# Patient Record
Sex: Female | Born: 1985 | Race: White | Hispanic: No | Marital: Married | Smoking: Never smoker
Health system: Southern US, Community
[De-identification: ages and names within clinical notes are randomized; demographics above are authoritative.]

## PROBLEM LIST (undated history)

## (undated) DIAGNOSIS — Z8489 Family history of other specified conditions: Secondary | ICD-10-CM

## (undated) DIAGNOSIS — Z789 Other specified health status: Secondary | ICD-10-CM

## (undated) HISTORY — PX: TONSILLECTOMY: SUR1361

---

## 2004-03-06 ENCOUNTER — Ambulatory Visit: Payer: Self-pay | Admitting: Otolaryngology

## 2009-06-21 ENCOUNTER — Ambulatory Visit: Payer: Self-pay | Admitting: Gastroenterology

## 2009-07-08 ENCOUNTER — Ambulatory Visit: Payer: Self-pay | Admitting: Gastroenterology

## 2018-03-30 ENCOUNTER — Other Ambulatory Visit: Payer: Self-pay | Admitting: Student

## 2018-03-30 DIAGNOSIS — R1011 Right upper quadrant pain: Secondary | ICD-10-CM

## 2018-03-30 DIAGNOSIS — R110 Nausea: Secondary | ICD-10-CM

## 2018-04-12 ENCOUNTER — Encounter
Admission: RE | Admit: 2018-04-12 | Discharge: 2018-04-12 | Disposition: A | Discharge status: Home / Self Care | Payer: BLUE CROSS/BLUE SHIELD | Source: Ambulatory Visit | Attending: Student | Admitting: Student

## 2018-04-12 DIAGNOSIS — R110 Nausea: Secondary | ICD-10-CM | POA: Insufficient documentation

## 2018-04-12 DIAGNOSIS — R1011 Right upper quadrant pain: Secondary | ICD-10-CM | POA: Insufficient documentation

## 2018-04-12 MED ORDER — TECHNETIUM TC 99M MEBROFENIN IV KIT
5.0000 | PACK | Freq: Once | INTRAVENOUS | Status: AC | PRN
  Administered 2018-04-12: 5.431 via INTRAVENOUS

## 2018-04-18 ENCOUNTER — Encounter: Payer: Self-pay | Admitting: General Surgery

## 2018-04-18 ENCOUNTER — Ambulatory Visit: Payer: BLUE CROSS/BLUE SHIELD | Admitting: General Surgery

## 2018-04-18 ENCOUNTER — Other Ambulatory Visit: Payer: Self-pay

## 2018-04-18 DIAGNOSIS — K811 Chronic cholecystitis: Secondary | ICD-10-CM | POA: Diagnosis not present

## 2018-04-18 NOTE — Patient Instructions (Signed)

## 2018-04-18 NOTE — Progress Notes (Signed)
Patient ID: Catherine Owen, female   DOB: 03/18/1985, 33 y.o.   MRN: [id number]  Chief Complaint  Patient presents with  . Abdominal Pain    HPI Catherine Owen is a 33 y.o. female here today for a evaluation of right upper abdominal pain. Patient states she first noticed this about 3 years ago.  She has had episodes every 3-4 months since that time.  In August 2019 she return to her PCP because of a recurrent episode of pain.  In the last 2 month the pain has got worse, and is coming more frequently. She states the pain radiates into her right shoulder and lower mid back, starting in the epigastrium/right upper quadrant. Wakes up in the middle of night with pain and nausea. Bowel moves are not normal, stools float.  Had a HIDA scan done on 04/12/2018.  The unsure administered for the stimulation portion of the test reproduced her symptoms.  Only one episode is resolved with incidence of severe vomiting.  Most episodes result in episodes of the watery stool without blood or mucus.  She does report that at times her stools "float".  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  She has 2 children age 55 and 37, both girls.  She was accompanied today by her sister who is a patient in this office.  History reviewed. No pertinent past medical history.  Past Surgical History:  Procedure Laterality Date  . TONSILLECTOMY      Family History  Problem Relation Age of Onset  . Colon cancer Mother     Social History Social History   Tobacco Use  . Smoking status: Never Smoker  . Smokeless tobacco: Never Used  Substance Use Topics  . Alcohol use: Not Currently  . Drug use: Not on file    Allergies  Allergen Reactions  . Codeine Nausea And Vomiting    Current Outpatient Medications  Medication Sig Dispense Refill  . buPROPion (WELLBUTRIN XL) 300 MG 24 hr tablet Take 300 mg by mouth at bedtime.     Marland Kitchen levonorgestrel (MIRENA) 20 MCG/24HR IUD 1 each by  Intrauterine route.     . clobetasol ointment (TEMOVATE) 0.05 % Apply 1 application topically daily as needed (yeast infections).     Marland Kitchen ibuprofen (ADVIL,MOTRIN) 200 MG tablet Take 400 mg by mouth every 6 (six) hours as needed for moderate pain.    Marland Kitchen loratadine (CLARITIN) 10 MG tablet Take 10 mg by mouth daily as needed for allergies.     No current facility-administered medications for this visit.     Review of Systems Review of Systems  Constitutional: Negative.   Respiratory: Negative.   Gastrointestinal: Positive for abdominal pain, diarrhea and nausea. Negative for constipation and vomiting.    Blood pressure (!) 141/97, pulse 91, temperature 98.1 F (36.7 C), temperature source Skin, height 5\' 6"  (1.676 m), weight 152 lb (68.9 kg), SpO2 98 %.  Physical Exam Physical Exam Constitutional:      Appearance: She is well-developed.  Eyes:     General: No scleral icterus.    Conjunctiva/sclera: Conjunctivae normal.  Neck:     Musculoskeletal: Neck supple.  Cardiovascular:     Rate and Rhythm: Normal rate and regular rhythm.     Heart sounds: Normal heart sounds.  Pulmonary:     Effort: Pulmonary effort is normal.     Breath sounds: Normal breath sounds.  Abdominal:     General: Bowel sounds are normal.  Lymphadenopathy:     Cervical: No cervical adenopathy.  Skin:    General: Skin is warm and dry.  Neurological:     Mental Status: She is alert and oriented to person, place, and time.     Data Reviewed HIDA scan with ejection fraction dated April 12, 2018 completed at [facility] was reviewed.  Low EF at 32%.  Reproduction of symptoms.  Patent biliary tree. Laboratory studies from [facility] clinic dated March 29, 2018 showed a negative H. pylori breath test. Colonoscopy from [facility] dated February 20, 2016 showed a sessile serrated polyp without dysplasia in the cecum.  6 x 3 mm.  The procedure was completed for report of rectal bleeding.    Assessment    Chronic  cholecystitis, family history of biliary colic/cholelithiasis.    Plan  We spent a long time reviewing the pros and cons of elective cholecystectomy based on the information at hand.  Her symptoms are suggestive of a biliary tract, especially considering her failure to improve on Dexilant and Nexium.  3 out of 4 patients with a reproduction of symptoms during nuclear imaging stimulation will obtain a benefit from elective cholecystectomy.  1 out of 4 will not.  The potential for postprandial diarrhea was discussed.     At this time the patient is interested in proceeding to cholecystectomy.  Arrangements have been made for this to be scheduled on April 20, 2018.  Laparoscopic Cholecystectomy with Intraoperative Cholangiogram. The procedure, including it's potential risks and complications (including but not limited to infection, bleeding, injury to intra-abdominal organs or bile ducts, bile leak, poor cosmetic result, sepsis and death) were discussed with the patient in detail. Non-operative options, including their inherent risks (acute calculous cholecystitis with possible choledocholithiasis or gallstone pancreatitis, with the risk of ascending cholangitis, sepsis, and death) were discussed as well. The patient expressed and understanding of what we discussed and wishes to proceed with laparoscopic cholecystectomy. The patient further understands that if it is technically not possible, or it is unsafe to proceed laparoscopically, that I will convert to an open cholecystectomy.  HPI, Physical Exam, Assessment and Plan have been scribed under the direction and in the presence of Catherine Curry, MD.  Ples Specter, CMA  I have completed the exam and reviewed the above documentation for accuracy and completeness.  I agree with the above.  Museum/gallery conservator has been used and any errors in dictation or transcription are unintentional.  Catherine Owen, M.D., F.A.C.S.   Catherine Owen  Catherine Owen 04/18/2018, 2:17 PM  Patient's surgery to be scheduled for 04-20-18 at [facility] with Dr. Lemar Livings.  The patient is aware she will be contacted by the Pre-Admission Testing Department to complete a phone interview sometime in the near future.  The patient is aware to call the office should she have further questions.   Nicholes Mango, CMA

## 2018-04-19 ENCOUNTER — Other Ambulatory Visit: Payer: Self-pay

## 2018-04-19 ENCOUNTER — Encounter
Admission: RE | Admit: 2018-04-19 | Discharge: 2018-04-19 | Disposition: A | Discharge status: Home / Self Care | Payer: BLUE CROSS/BLUE SHIELD | Source: Ambulatory Visit | Attending: General Surgery | Admitting: General Surgery

## 2018-04-19 HISTORY — DX: Family history of other specified conditions: Z84.89

## 2018-04-19 HISTORY — DX: Other specified health status: Z78.9

## 2018-04-19 NOTE — Patient Instructions (Signed)
Your procedure is scheduled on: 04-20-18 Report to Same Day Surgery 2nd floor medical mall [facility] Entrance-take elevator on left to 2nd floor.  Check in with surgery information desk.) To find out your arrival time please call [phone] between 1PM - 3PM on 04-19-18  Remember: Instructions that are not followed completely may result in serious medical risk, up to and including death, or upon the discretion of your surgeon and anesthesiologist your surgery may need to be rescheduled.    _x___ 1. Do not eat food after midnight the night before your procedure. You may drink clear liquids up to 2 hours before you are scheduled to arrive at the hospital for your procedure.  Do not drink clear liquids within 2 hours of your scheduled arrival to the hospital.  Clear liquids include  --Water or Apple juice without pulp  --Clear carbohydrate beverage such as ClearFast or Gatorade  --Black Coffee or Clear Tea (No milk, no creamers, do not add anything to  the coffee or Tea   ____Ensure clear carbohydrate drink on the way to the hospital for bariatric patients  ____Ensure clear carbohydrate drink 3 hours before surgery for Dr Rutherford Nail patients if physician instructed.   No gum chewing or hard candies.     __x__ 2. No Alcohol for 24 hours before or after surgery.   __x__3. No Smoking or e-cigarettes for 24 prior to surgery.  Do not use any chewable tobacco products for at least 6 hour prior to surgery   ____  4. Bring all medications with you on the day of surgery if instructed.    __x__ 5. Notify your doctor if there is any change in your medical condition     (cold, fever, infections).    x___6. On the morning of surgery brush your teeth with toothpaste and water.  You may rinse your mouth with mouth wash if you wish.  Do not swallow any toothpaste or mouthwash.   Do not wear jewelry, make-up, hairpins, clips or nail polish.  Do not wear lotions, powders, or perfumes. You may wear  deodorant.  Do not shave 48 hours prior to surgery. Men may shave face and neck.  Do not bring valuables to the hospital.    [facility] is not responsible for any belongings or valuables.               Contacts, dentures or bridgework may not be worn into surgery.  Leave your suitcase in the car. After surgery it may be brought to your room.  For patients admitted to the hospital, discharge time is determined by your treatment team.  _  Patients discharged the day of surgery will not be allowed to drive home.  You will need someone to drive you home and stay with you the night of your procedure.    Please read over the following fact sheets that you were given:   [facility] Preparing for Surgery   ____ Take anti-hypertensive listed below, cardiac, seizure, asthma,anti-reflux and psychiatric medicines. These include:  1. NONE  2.  3.  4.  5.  6.  ____Fleets enema or Magnesium Citrate as directed.   ____ Use CHG Soap or sage wipes as directed on instruction sheet   ____ Use inhalers on the day of surgery and bring to hospital day of surgery  ____ Stop Metformin and Janumet 2 days prior to surgery.    ____ Take 1/2 of usual insulin dose the night before surgery and none on  the morning surgery.   ____ Follow recommendations from Cardiologist, Pulmonologist or PCP regarding     stopping Aspirin, Coumadin, Plavix ,Eliquis, Effient, or Pradaxa, and Pletal.  X____Stop Anti-inflammatories such as Advil, Aleve, Ibuprofen, Motrin, Naproxen, Naprosyn, Goodies powders or aspirin products NOW-OK to take Tylenol    ____ Stop supplements until after surgery.     ____ Bring C-Pap to the hospital.

## 2018-04-20 ENCOUNTER — Ambulatory Visit: Payer: BLUE CROSS/BLUE SHIELD | Admitting: Certified Registered"

## 2018-04-20 ENCOUNTER — Ambulatory Visit
Admission: RE | Admit: 2018-04-20 | Discharge: 2018-04-20 | Disposition: A | Discharge status: Home / Self Care | Payer: BLUE CROSS/BLUE SHIELD | Attending: General Surgery | Admitting: General Surgery

## 2018-04-20 ENCOUNTER — Other Ambulatory Visit: Payer: Self-pay

## 2018-04-20 ENCOUNTER — Ambulatory Visit: Payer: BLUE CROSS/BLUE SHIELD

## 2018-04-20 ENCOUNTER — Encounter
Admission: RE | Disposition: A | Discharge status: Home / Self Care | Payer: Self-pay | Source: Home / Self Care | Attending: General Surgery

## 2018-04-20 ENCOUNTER — Encounter: Payer: Self-pay | Admitting: Emergency Medicine

## 2018-04-20 DIAGNOSIS — Z975 Presence of (intrauterine) contraceptive device: Secondary | ICD-10-CM | POA: Diagnosis not present

## 2018-04-20 DIAGNOSIS — Z79899 Other long term (current) drug therapy: Secondary | ICD-10-CM | POA: Insufficient documentation

## 2018-04-20 DIAGNOSIS — K819 Cholecystitis, unspecified: Secondary | ICD-10-CM

## 2018-04-20 DIAGNOSIS — Z885 Allergy status to narcotic agent status: Secondary | ICD-10-CM | POA: Diagnosis not present

## 2018-04-20 DIAGNOSIS — Z8489 Family history of other specified conditions: Secondary | ICD-10-CM | POA: Diagnosis not present

## 2018-04-20 DIAGNOSIS — K8020 Calculus of gallbladder without cholecystitis without obstruction: Secondary | ICD-10-CM

## 2018-04-20 DIAGNOSIS — K811 Chronic cholecystitis: Secondary | ICD-10-CM | POA: Insufficient documentation

## 2018-04-20 HISTORY — PX: CHOLECYSTECTOMY: SHX55

## 2018-04-20 LAB — HEPATIC FUNCTION PANEL
ALBUMIN: 4.6 g/dL (ref 3.5–5.0)
ALT: 12 U/L (ref 0–44)
AST: 15 U/L (ref 15–41)
Alkaline Phosphatase: 69 U/L (ref 38–126)
Bilirubin, Direct: <0.1 mg/dL (ref 0.0–0.2)
Total Bilirubin: 0.8 mg/dL (ref 0.3–1.2)
Total Protein: 7.9 g/dL (ref 6.5–8.1)

## 2018-04-20 LAB — LIPASE, BLOOD: LIPASE: 31 U/L (ref 11–51)

## 2018-04-20 LAB — POCT PREGNANCY, URINE: Preg Test, Ur: NEGATIVE

## 2018-04-20 SURGERY — LAPAROSCOPIC CHOLECYSTECTOMY WITH INTRAOPERATIVE CHOLANGIOGRAM
Anesthesia: General

## 2018-04-20 MED ORDER — DEXAMETHASONE SODIUM PHOSPHATE 10 MG/ML IJ SOLN
INTRAMUSCULAR | Status: DC | PRN
  Administered 2018-04-20: 5 mg via INTRAVENOUS

## 2018-04-20 MED ORDER — PROPOFOL 10 MG/ML IV BOLUS
INTRAVENOUS | Status: DC | PRN
  Administered 2018-04-20: 150 mg via INTRAVENOUS

## 2018-04-20 MED ORDER — ONDANSETRON HCL 4 MG/2ML IJ SOLN
INTRAMUSCULAR | Status: AC
  Filled 2018-04-20: qty 2

## 2018-04-20 MED ORDER — KETOROLAC TROMETHAMINE 30 MG/ML IJ SOLN
INTRAMUSCULAR | Status: AC
  Filled 2018-04-20: qty 1

## 2018-04-20 MED ORDER — ACETAMINOPHEN 10 MG/ML IV SOLN
INTRAVENOUS | Status: DC | PRN
  Administered 2018-04-20: 1000 mg via INTRAVENOUS

## 2018-04-20 MED ORDER — SODIUM CHLORIDE FLUSH 0.9 % IV SOLN
INTRAVENOUS | Status: AC
  Filled 2018-04-20: qty 10

## 2018-04-20 MED ORDER — MIDAZOLAM HCL 2 MG/2ML IJ SOLN
INTRAMUSCULAR | Status: AC
  Filled 2018-04-20: qty 2

## 2018-04-20 MED ORDER — PROPOFOL 10 MG/ML IV BOLUS
INTRAVENOUS | Status: AC
  Filled 2018-04-20: qty 20

## 2018-04-20 MED ORDER — FENTANYL CITRATE (PF) 250 MCG/5ML IJ SOLN
INTRAMUSCULAR | Status: AC
  Filled 2018-04-20: qty 5

## 2018-04-20 MED ORDER — DEXAMETHASONE SODIUM PHOSPHATE 10 MG/ML IJ SOLN
INTRAMUSCULAR | Status: AC
  Filled 2018-04-20: qty 1

## 2018-04-20 MED ORDER — ACETAMINOPHEN 10 MG/ML IV SOLN
INTRAVENOUS | Status: AC
  Filled 2018-04-20: qty 100

## 2018-04-20 MED ORDER — MEPERIDINE HCL 50 MG/ML IJ SOLN: 6.2500 mg | INTRAMUSCULAR | Status: DC | PRN

## 2018-04-20 MED ORDER — FENTANYL CITRATE (PF) 100 MCG/2ML IJ SOLN
INTRAMUSCULAR | Status: AC
  Filled 2018-04-20: qty 2

## 2018-04-20 MED ORDER — PROMETHAZINE HCL 12.5 MG PO TABS: 12.5000 mg | ORAL_TABLET | ORAL | 0 refills | Status: DC | PRN

## 2018-04-20 MED ORDER — KETOROLAC TROMETHAMINE 30 MG/ML IJ SOLN
INTRAMUSCULAR | Status: DC | PRN
  Administered 2018-04-20: 30 mg via INTRAVENOUS

## 2018-04-20 MED ORDER — FAMOTIDINE 20 MG PO TABS
20.0000 mg | ORAL_TABLET | Freq: Once | ORAL | Status: AC
  Administered 2018-04-20: 20 mg via ORAL

## 2018-04-20 MED ORDER — SCOPOLAMINE 1 MG/3DAYS TD PT72
1.0000 | MEDICATED_PATCH | TRANSDERMAL | Status: DC
  Administered 2018-04-20: 1.5 mg via TRANSDERMAL

## 2018-04-20 MED ORDER — OXYCODONE HCL 5 MG/5ML PO SOLN: 5.0000 mg | Freq: Once | ORAL | Status: AC | PRN

## 2018-04-20 MED ORDER — FENTANYL CITRATE (PF) 100 MCG/2ML IJ SOLN
INTRAMUSCULAR | Status: DC | PRN
  Administered 2018-04-20 (×2): 50 ug via INTRAVENOUS

## 2018-04-20 MED ORDER — SODIUM CHLORIDE 0.9 % IV SOLN
INTRAVENOUS | Status: DC | PRN
  Administered 2018-04-20: 12 mL

## 2018-04-20 MED ORDER — ROCURONIUM BROMIDE 50 MG/5ML IV SOLN
INTRAVENOUS | Status: AC
  Filled 2018-04-20: qty 1

## 2018-04-20 MED ORDER — ONDANSETRON HCL 4 MG/2ML IJ SOLN
INTRAMUSCULAR | Status: DC | PRN
  Administered 2018-04-20: 4 mg via INTRAVENOUS

## 2018-04-20 MED ORDER — FAMOTIDINE 20 MG PO TABS
ORAL_TABLET | ORAL | Status: AC
  Administered 2018-04-20: 20 mg via ORAL
  Filled 2018-04-20: qty 1

## 2018-04-20 MED ORDER — LIDOCAINE HCL (PF) 2 % IJ SOLN
INTRAMUSCULAR | Status: AC
  Filled 2018-04-20: qty 10

## 2018-04-20 MED ORDER — SCOPOLAMINE 1 MG/3DAYS TD PT72
MEDICATED_PATCH | TRANSDERMAL | Status: AC
  Administered 2018-04-20: 1.5 mg via TRANSDERMAL
  Filled 2018-04-20: qty 1

## 2018-04-20 MED ORDER — SUGAMMADEX SODIUM 200 MG/2ML IV SOLN
INTRAVENOUS | Status: DC | PRN
  Administered 2018-04-20: 130 mg via INTRAVENOUS

## 2018-04-20 MED ORDER — MIDAZOLAM HCL 2 MG/2ML IJ SOLN
INTRAMUSCULAR | Status: DC | PRN
  Administered 2018-04-20: 2 mg via INTRAVENOUS

## 2018-04-20 MED ORDER — PROMETHAZINE HCL 25 MG/ML IJ SOLN
INTRAMUSCULAR | Status: AC
  Filled 2018-04-20: qty 1

## 2018-04-20 MED ORDER — LACTATED RINGERS IV SOLN
INTRAVENOUS | Status: DC
  Administered 2018-04-20: 13:00:00 via INTRAVENOUS

## 2018-04-20 MED ORDER — HYDROCODONE-ACETAMINOPHEN 5-325 MG PO TABS: 1.0000 | ORAL_TABLET | ORAL | 0 refills | Status: DC | PRN

## 2018-04-20 MED ORDER — LIDOCAINE HCL (CARDIAC) PF 100 MG/5ML IV SOSY
PREFILLED_SYRINGE | INTRAVENOUS | Status: DC | PRN
  Administered 2018-04-20: 60 mg via INTRAVENOUS

## 2018-04-20 MED ORDER — OXYCODONE HCL 5 MG PO TABS
ORAL_TABLET | ORAL | Status: AC
  Administered 2018-04-20: 5 mg via ORAL
  Filled 2018-04-20: qty 1

## 2018-04-20 MED ORDER — FENTANYL CITRATE (PF) 100 MCG/2ML IJ SOLN
25.0000 ug | INTRAMUSCULAR | Status: DC | PRN
  Administered 2018-04-20: 25 ug via INTRAVENOUS

## 2018-04-20 MED ORDER — PROMETHAZINE HCL 25 MG/ML IJ SOLN
6.2500 mg | INTRAMUSCULAR | Status: DC | PRN
  Administered 2018-04-20: 6.25 mg via INTRAVENOUS

## 2018-04-20 MED ORDER — OXYCODONE HCL 5 MG PO TABS
5.0000 mg | ORAL_TABLET | Freq: Once | ORAL | Status: AC | PRN
  Administered 2018-04-20: 5 mg via ORAL

## 2018-04-20 MED ORDER — CEFAZOLIN SODIUM-DEXTROSE 2-4 GM/100ML-% IV SOLN
INTRAVENOUS | Status: AC
  Filled 2018-04-20: qty 100

## 2018-04-20 MED ORDER — ROCURONIUM BROMIDE 100 MG/10ML IV SOLN
INTRAVENOUS | Status: DC | PRN
  Administered 2018-04-20: 40 mg via INTRAVENOUS

## 2018-04-20 MED ORDER — SUGAMMADEX SODIUM 200 MG/2ML IV SOLN
INTRAVENOUS | Status: AC
  Filled 2018-04-20: qty 2

## 2018-04-20 SURGICAL SUPPLY — 41 items
APPLIER CLIP ROT 10 11.4 M/L (STAPLE) ×3
BLADE SURG 11 STRL SS SAFETY (MISCELLANEOUS) ×3 IMPLANT
CANISTER SUCT 1200ML W/VALVE (MISCELLANEOUS) ×3 IMPLANT
CANNULA DILATOR  5MM W/SLV (CANNULA) ×2
CANNULA DILATOR 10 W/SLV (CANNULA) ×2 IMPLANT
CANNULA DILATOR 10MM W/SLV (CANNULA) ×1
CANNULA DILATOR 5 W/SLV (CANNULA) ×4 IMPLANT
CATH CHOLANG 76X19 KUMAR (CATHETERS) ×3 IMPLANT
CHLORAPREP W/TINT 26ML (MISCELLANEOUS) ×3 IMPLANT
CLIP APPLIE ROT 10 11.4 M/L (STAPLE) ×1 IMPLANT
CLOSURE WOUND 1/2 X4 (GAUZE/BANDAGES/DRESSINGS) ×1
CONRAY 60ML FOR OR (MISCELLANEOUS) ×3 IMPLANT
COVER WAND RF STERILE (DRAPES) ×3 IMPLANT
DISSECTOR KITTNER STICK (MISCELLANEOUS) ×1 IMPLANT
DISSECTORS/KITTNER STICK (MISCELLANEOUS) ×3
DRAPE SHEET LG 3/4 BI-LAMINATE (DRAPES) ×3 IMPLANT
DRSG TEGADERM 2-3/8X2-3/4 SM (GAUZE/BANDAGES/DRESSINGS) ×12 IMPLANT
DRSG TELFA 4X3 1S NADH ST (GAUZE/BANDAGES/DRESSINGS) ×3 IMPLANT
ELECT REM PT RETURN 9FT ADLT (ELECTROSURGICAL) ×3
ELECTRODE REM PT RTRN 9FT ADLT (ELECTROSURGICAL) ×1 IMPLANT
GLOVE BIO SURGEON STRL SZ7.5 (GLOVE) ×3 IMPLANT
GLOVE INDICATOR 8.0 STRL GRN (GLOVE) ×3 IMPLANT
GOWN STRL REUS W/ TWL LRG LVL3 (GOWN DISPOSABLE) ×3 IMPLANT
GOWN STRL REUS W/TWL LRG LVL3 (GOWN DISPOSABLE) ×6
IRRIGATION STRYKERFLOW (MISCELLANEOUS) ×1 IMPLANT
IRRIGATOR STRYKERFLOW (MISCELLANEOUS) ×3
IV LACTATED RINGERS 1000ML (IV SOLUTION) ×3 IMPLANT
KIT TURNOVER KIT A (KITS) ×3 IMPLANT
LABEL OR SOLS (LABEL) ×3 IMPLANT
NDL INSUFF ACCESS 14 VERSASTEP (NEEDLE) ×3 IMPLANT
NS IRRIG 500ML POUR BTL (IV SOLUTION) ×3 IMPLANT
PACK LAP CHOLECYSTECTOMY (MISCELLANEOUS) ×3 IMPLANT
POUCH SPECIMEN RETRIEVAL 10MM (ENDOMECHANICALS) IMPLANT
SCISSORS METZENBAUM CVD 33 (INSTRUMENTS) ×3 IMPLANT
SET TUBE SMOKE EVAC HIGH FLOW (TUBING) ×3 IMPLANT
STRIP CLOSURE SKIN 1/2X4 (GAUZE/BANDAGES/DRESSINGS) ×2 IMPLANT
SUT VIC AB 0 CT2 27 (SUTURE) ×3 IMPLANT
SUT VIC AB 4-0 FS2 27 (SUTURE) ×3 IMPLANT
SWABSTK COMLB BENZOIN TINCTURE (MISCELLANEOUS) ×3 IMPLANT
TROCAR XCEL NON-BLD 11X100MML (ENDOMECHANICALS) ×3 IMPLANT
WATER STERILE IRR 1000ML POUR (IV SOLUTION) ×3 IMPLANT

## 2018-04-20 NOTE — H&P (Signed)
No change in clinical symptoms or exam.  For cholecystectomy.  

## 2018-04-20 NOTE — Discharge Instructions (Signed)

## 2018-04-20 NOTE — Anesthesia Preprocedure Evaluation (Signed)
Anesthesia Evaluation  Patient identified by MRN, date of birth, ID band Patient awake    Reviewed: Allergy & Precautions, NPO status , Patient's Chart, lab work & pertinent test results  History of Anesthesia Complications Negative for: history of anesthetic complications  Airway Mallampati: II  TM Distance: >3 FB Neck ROM: Full    Dental no notable dental hx.    Pulmonary neg pulmonary ROS, neg sleep apnea, neg COPD,    breath sounds clear to auscultation- rhonchi (-) wheezing      Cardiovascular Exercise Tolerance: Good (-) hypertension(-) CAD and (-) Past MI  Rhythm:Regular Rate:Normal - Systolic murmurs and - Diastolic murmurs    Neuro/Psych neg Seizures negative neurological ROS  negative psych ROS   GI/Hepatic negative GI ROS, Neg liver ROS,   Endo/Other  negative endocrine ROSneg diabetes  Renal/GU negative Renal ROS     Musculoskeletal negative musculoskeletal ROS (+)   Abdominal (+) - obese,   Peds  Hematology negative hematology ROS (+)   Anesthesia Other Findings Past Medical History: No date: Family history of adverse reaction to anesthesia     Comment:  MOM-N/V No date: Medical history non-contributory   Reproductive/Obstetrics                             Anesthesia Physical Anesthesia Plan  ASA: I  Anesthesia Plan: General   Post-op Pain Management:    Induction: Intravenous  PONV Risk Score and Plan: 2 and Ondansetron, Dexamethasone, Midazolam and Scopolamine patch - Pre-op  Airway Management Planned: Oral ETT  Additional Equipment:   Intra-op Plan:   Post-operative Plan: Extubation in OR  Informed Consent: I have reviewed the patients History and Physical, chart, labs and discussed the procedure including the risks, benefits and alternatives for the proposed anesthesia with the patient or authorized representative who has indicated his/her understanding  and acceptance.     Dental advisory given  Plan Discussed with: CRNA and Anesthesiologist  Anesthesia Plan Comments:         Anesthesia Quick Evaluation

## 2018-04-20 NOTE — Anesthesia Postprocedure Evaluation (Signed)
Anesthesia Post Note  Patient: Catherine Owen  Procedure(s) Performed: LAPAROSCOPIC CHOLECYSTECTOMY WITH INTRAOPERATIVE CHOLANGIOGRAM (N/A )  Patient location during evaluation: PACU Anesthesia Type: General Level of consciousness: awake and alert and oriented Pain management: pain level controlled Vital Signs Assessment: post-procedure vital signs reviewed and stable Respiratory status: spontaneous breathing, nonlabored ventilation and respiratory function stable Cardiovascular status: blood pressure returned to baseline and stable Postop Assessment: no signs of nausea or vomiting Anesthetic complications: no     Last Vitals:  Vitals:   04/20/18 1251 04/20/18 1446  BP: 129/85 136/90  Pulse: 86 100  Resp: 17 (!) 22  Temp: 36.5 C 36.5 C  SpO2: 100% 100%    Last Pain:  Vitals:   04/20/18 1446  TempSrc:   PainSc: Asleep                 Raquel Sayres

## 2018-04-20 NOTE — Transfer of Care (Signed)
Immediate Anesthesia Transfer of Care Note  Patient: Catherine Owen  Procedure(s) Performed: LAPAROSCOPIC CHOLECYSTECTOMY WITH INTRAOPERATIVE CHOLANGIOGRAM (N/A )  Patient Location: PACU  Anesthesia Type:General  Level of Consciousness: awake, alert  and drowsy  Airway & Oxygen Therapy: Patient Spontanous Breathing and Patient connected to face mask oxygen  Post-op Assessment: Report given to RN and Post -op Vital signs reviewed and stable  Post vital signs: Reviewed and stable  Last Vitals:  Vitals Value Taken Time  BP 136/90 04/20/2018  2:46 PM  Temp 36.5 C 04/20/2018  2:46 PM  Pulse 94 04/20/2018  2:51 PM  Resp 24 04/20/2018  2:51 PM  SpO2 100 % 04/20/2018  2:51 PM  Vitals shown include unvalidated device data.  Last Pain:  Vitals:   04/20/18 1446  TempSrc:   PainSc: Asleep         Complications: No apparent anesthesia complications and Patient re-intubated

## 2018-04-20 NOTE — Anesthesia Procedure Notes (Signed)
Procedure Name: Intubation Performed by: Fredderick Phenix, CRNA Pre-anesthesia Checklist: Patient identified, Emergency Drugs available, Suction available, Patient being monitored and Timeout performed Patient Re-evaluated:Patient Re-evaluated prior to induction Oxygen Delivery Method: Circle system utilized Preoxygenation: Pre-oxygenation with 100% oxygen Induction Type: IV induction Ventilation: Mask ventilation without difficulty Laryngoscope Size: Mac and 3 Grade View: Grade I Tube type: Oral Tube size: 7.0 mm Number of attempts: 1 Placement Confirmation: ETT inserted through vocal cords under direct vision Secured at: 1 cm Tube secured with: Tape

## 2018-04-20 NOTE — Anesthesia Post-op Follow-up Note (Signed)
Anesthesia QCDR form completed.        

## 2018-04-20 NOTE — Op Note (Signed)
Preoperative diagnosis: Chronic cholecystitis.  Postoperative diagnosis: Same.  Operative procedure: Laparoscopic cholecystectomy with intraoperative cholangiograms.  Operating Surgeon: Jasmine December, MD.  Anesthesia: General endotracheal.  Estimated blood loss: Less than 5 cc.  Clinical note: This 33 year old woman has had episodic right upper quadrant pain with radiation of the back.  Ultrasound was negative but HIDA scan showed a mildly depressed ejection fraction with reproduction of symptoms on stimulation.  She has had no improvement with the use of Nexium or Dexilant and she was felt to be a candidate for elective cholecystectomy based on her clinical history and family history of cholelithiasis.  Operative note: The patient underwent general endotracheal anesthesia without difficulty.  SCD stockings for DVT prevention.  The abdomen was cleansed with ChloraPrep and draped.  Through a trans-umbilical incision a varies needle was placed and after assuring intra-abdominal location with a hanging drop test the abdomen was insufflated with CO2 of 10 mmHg pressure.  A 10 mm Step port was expanded and inspection showed no evidence of injury from initial port placement.  The patient was placed in the reverse Trendelenburg position and rolled to the left.  An 11 mm XL port was placed in the epigastrium and 2-5 mm Ports placed in the right lateral abdominal wall.  The gallbladder showed mild duskiness without evidence of acute inflammation.  This was placed on cephalad traction.  The neck of the gallbladder was exposed.  A Kumar clamp was placed and fluoroscopic cholangiograms completed using 8 cc of one half strength Conray 60.  This showed prompt filling of the right and left hepatic ducts free flow into the duodenum and no evidence of retained stones.  The cystic duct was doubly clipped and divided.  The right hepatic artery was found to be running adjacent to the gallbladder and the tiny cystic  artery was doubly clipped and divided.  The gallbladder was removed from the liver bed making use of hook cautery dissection.  It was placed into an Endo Catch bag.  Inspection showed no evidence of stones but cholesterolosis.  The right upper quadrant was irrigated with lactated Ringer solution.  Inspection from the epigastric site showed no evidence of injury from initial port placement.  The abdomen was then desufflated under direct vision.  The fascia at the umbilicus was approximated with a single 0 Vicryl suture.  Skin incisions were closed with 4-0 Vicryl subcuticular sutures.  Benzoin, Steri-Strips, Telfa and Tegaderm dressings were applied.  The patient tolerated the procedure well and was taken recovery room in stable condition.

## 2018-04-21 ENCOUNTER — Encounter: Payer: Self-pay | Admitting: General Surgery

## 2018-04-22 LAB — SURGICAL PATHOLOGY

## 2018-04-28 ENCOUNTER — Encounter: Payer: Self-pay | Admitting: General Surgery

## 2018-04-28 ENCOUNTER — Other Ambulatory Visit: Payer: Self-pay

## 2018-04-28 ENCOUNTER — Ambulatory Visit (INDEPENDENT_AMBULATORY_CARE_PROVIDER_SITE_OTHER): Payer: BLUE CROSS/BLUE SHIELD | Admitting: General Surgery

## 2018-04-28 ENCOUNTER — Ambulatory Visit: Payer: Self-pay | Admitting: General Surgery

## 2018-04-28 VITALS — BP 127/84 | HR 92 | Temp 97.7°F | Resp 16 | Ht 66.0 in | Wt 157.0 lb

## 2018-04-28 DIAGNOSIS — K811 Chronic cholecystitis: Secondary | ICD-10-CM

## 2018-04-28 NOTE — Patient Instructions (Addendum)
The patient is aware to call back for any questions or new concerns. Resume activities as tolerated. Proper lifting techniques reviewed. May continue to use heating pad as needed for comfort Follow up as needed.

## 2018-04-28 NOTE — Progress Notes (Signed)
Patient ID: Catherine Owen, female   DOB: 02-10-1986, 33 y.o.   MRN: [id number]  Chief Complaint  Patient presents with  . Routine Post Op    post op gallbladder removed 04/20/18    HPI Catherine Owen is a 33 y.o. female.  Here for postoperative visit, lap cholecystectomy on 04-20-18. She states she is doing well, bowels are loose but getting better.  HPI  Past Medical History:  Diagnosis Date  . Family history of adverse reaction to anesthesia    MOM-N/V  . Medical history non-contributory     Past Surgical History:  Procedure Laterality Date  . CHOLECYSTECTOMY N/A 04/20/2018   Procedure: LAPAROSCOPIC CHOLECYSTECTOMY WITH INTRAOPERATIVE CHOLANGIOGRAM;  Surgeon: Earline Mayotte, MD;  Location: [facility] ORS;  Service: General;  Laterality: N/A;  . TONSILLECTOMY      Family History  Problem Relation Age of Onset  . Colon cancer Mother     Social History Social History   Tobacco Use  . Smoking status: Never Smoker  . Smokeless tobacco: Never Used  Substance Use Topics  . Alcohol use: Yes    Comment: SOCIALLY  . Drug use: Never    Allergies  Allergen Reactions  . Codeine Nausea And Vomiting    Current Outpatient Medications  Medication Sig Dispense Refill  . buPROPion (WELLBUTRIN XL) 300 MG 24 hr tablet Take 300 mg by mouth at bedtime.     . clobetasol ointment (TEMOVATE) 0.05 % Apply 1 application topically daily as needed (yeast infections).     Marland Kitchen ibuprofen (ADVIL,MOTRIN) 200 MG tablet Take 400 mg by mouth every 6 (six) hours as needed for moderate pain.    Marland Kitchen levonorgestrel (MIRENA) 20 MCG/24HR IUD 1 each by Intrauterine route.     . loratadine (CLARITIN) 10 MG tablet Take 10 mg by mouth daily as needed for allergies.     No current facility-administered medications for this visit.     Review of Systems Review of Systems  Constitutional: Negative.   Respiratory: Negative.   Cardiovascular: Negative.     Blood pressure 127/84, pulse 92, temperature 97.7  F (36.5 C), temperature source Temporal, resp. rate 16, height 5\' 6"  (1.676 m), weight 157 lb (71.2 kg), SpO2 98 %.  Physical Exam Physical Exam Constitutional:      Appearance: Normal appearance.  Eyes:     General: No scleral icterus. Neck:     Musculoskeletal: Neck supple.  Cardiovascular:     Rate and Rhythm: Normal rate and regular rhythm.     Pulses: Normal pulses.     Heart sounds: Normal heart sounds.  Pulmonary:     Effort: Pulmonary effort is normal.     Breath sounds: Normal breath sounds.  Abdominal:    Skin:    General: Skin is warm and dry.  Neurological:     Mental Status: She is alert and oriented to person, place, and time.  Psychiatric:        Mood and Affect: Mood normal.        Behavior: Behavior normal.     Data Reviewed April 20, 2026 pathology and laboratory results: A. GALLBLADDER, CHOLECYSTECTOMY:  - CHRONIC CHOLECYSTITIS AND CHOLESTEROLOSIS.   Total Protein 6.5 - 8.1 g/dL 7.9   Albumin 3.5 - 5.0 g/dL 4.6   AST 15 - 41 U/L 15   ALT 0 - 44 U/L 12   Alkaline Phosphatase 38 - 126 U/L 69   Total Bilirubin 0.3 - 1.2 mg/dL 0.8   Bilirubin, Direct  0.0 - 0.2 mg/dL <1.6   Indirect Bilirubin 0.3 - 0.9 mg/dL NOT CALCULATED    Lipase 11 - 51 U/L 31      Assessment Doing well post cholecystectomy.   Plan  The patient is aware to call back for any questions or new concerns. Resume activities as tolerated. Proper lifting techniques reviewed. May continue to use heating pad as needed for comfort Follow up as needed.  HPI, assessment, plan and physical exam has been scribed under the direction and in the presence of Earline Mayotte, MD. Dorathy Daft, RN  I have completed the exam and reviewed the above documentation for accuracy and completeness.  I agree with the above.  Museum/gallery conservator has been used and any errors in dictation or transcription are unintentional.  Donnalee Curry, M.D., F.A.C.S.  Catherine Owen 04/28/2018, 10:13  AM

## 2018-05-06 ENCOUNTER — Telehealth: Payer: Self-pay

## 2018-05-06 NOTE — Telephone Encounter (Signed)
Patient has been informed that her FMLA paper work has been completed and faxed over a copy has been made and placed in the scan box along with a copy being placed in the done folder.

## 2019-12-31 IMAGING — NM NM HEPATO W/GB/PHARM/[PERSON_NAME]
2 series · 12 of 12 positions shown · non-contrast
Comparison: None.

CLINICAL DATA: Upper abdominal pain with nausea

EXAM:
NUCLEAR MEDICINE HEPATOBILIARY IMAGING WITH GALLBLADDER EF
VIEWS:
Anterior right upper quadrant.
RADIOPHARMACEUTICALS:  5.431 mCi Oc-YYm  Choletec IV

[Series 1000: gallbladder ef · 4.80mm/px · 6 of 120 frames shown]
[frame 11/120]
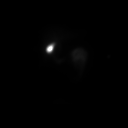
[frame 31/120]
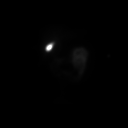
[frame 51/120]
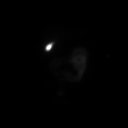
[frame 71/120]
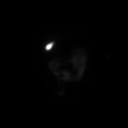
[frame 91/120]
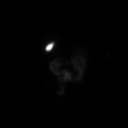
[frame 111/120]
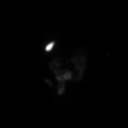

[Series 1000: hepatobiliary scan · 9.59mm/px · 6 of 60 frames shown]
[frame 6/60]
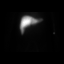
[frame 16/60]
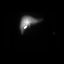
[frame 26/60]
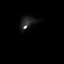
[frame 36/60]
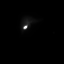
[frame 46/60]
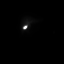
[frame 56/60]
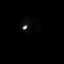

[12 of 12 positions shown; findings below may reference images not displayed]

FINDINGS: Liver uptake of radiotracer is unremarkable. There is prompt
visualization of gallbladder and small bowel, indicating patency of
the cystic and common bile ducts. The patient consumed 8 ounces of
Ensure orally with calculation of the computer generated ejection
fraction of radiotracer from the gallbladder. The patient did
experience pain with the oral Ensure consumption. The computer
generated ejection fraction of radiotracer from the gallbladder is
mildly diminished at 32%, normal greater than 33% using the oral
agent.
IMPRESSION: Mildly diminished ejection fraction of radiotracer from the
gallbladder, a finding felt to be indicative of a degree of biliary
dyskinesia. The patient did experience clinical symptoms with the
oral Ensure consumption. Cystic and common bile ducts are patent as
is evidenced by visualization of gallbladder and small bowel.

## 2020-01-08 IMAGING — CR DG CHOLANGIOGRAM OPERATIVE
3 series · 14 of 17 positions shown · non-contrast
Comparison: Nuclear medicine HIDA scan with gallbladder ejection
fraction-04/12/2018

CLINICAL DATA: Intraoperative cholangiogram during laparoscopic
cholecystectomy.

EXAM:
INTRAOPERATIVE CHOLANGIOGRAM
FLUOROSCOPY TIME:  9 seconds

[Series 1: cont. · 2 of 3 frames shown (1 of 3)]
[frame 1/3]
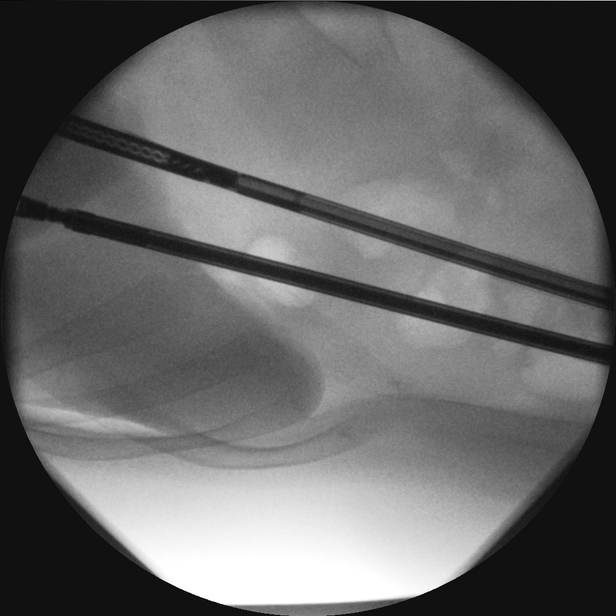
[frame 2/3]
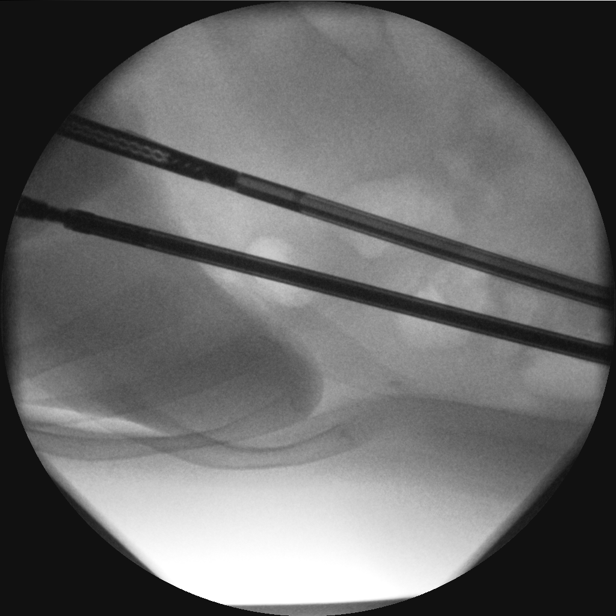

[Series 2: cont. · 4 of 4 frames shown (2 of 3)]
[frame 1/4]
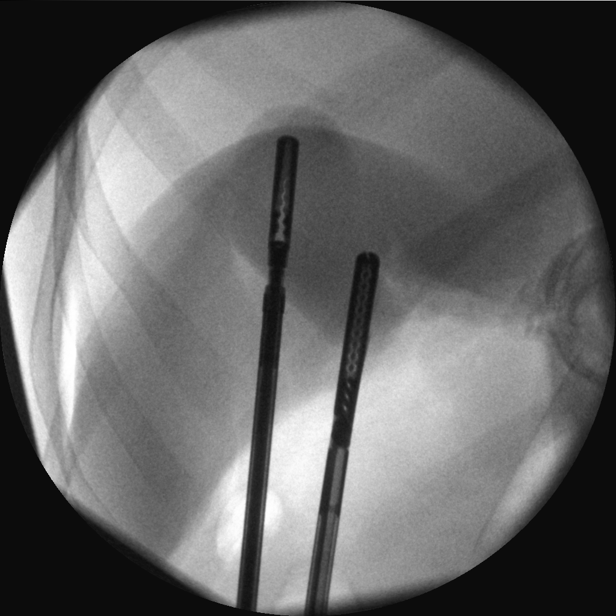
[frame 2/4]
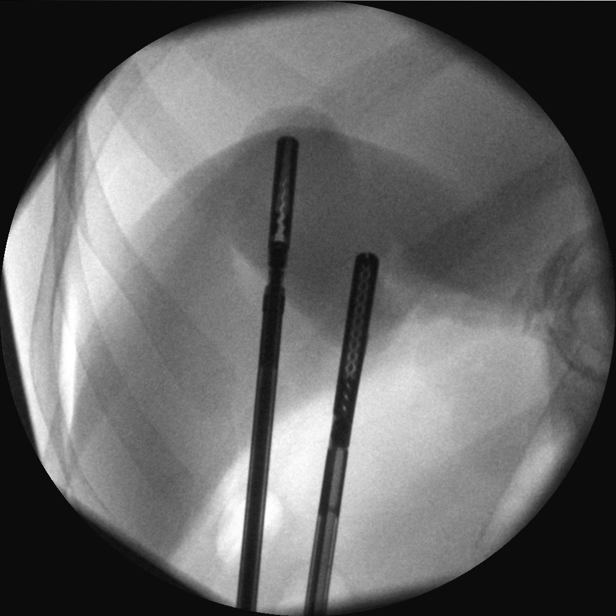
[frame 3/4]
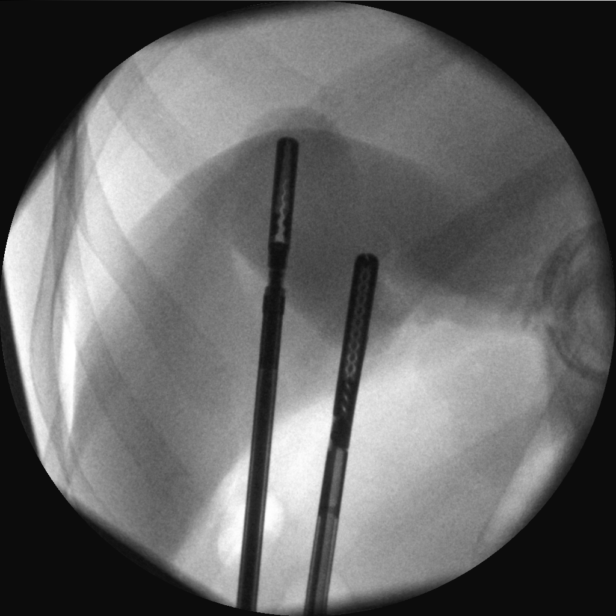
[frame 4/4]
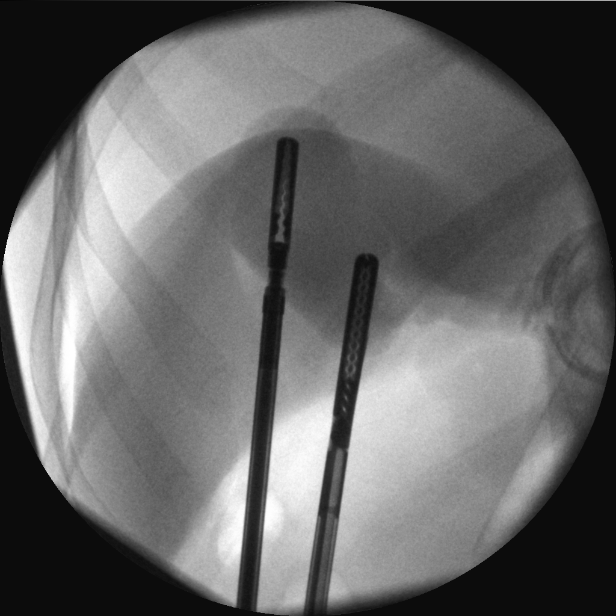

[Series 3: cont. · 8 of 38 frames shown (3 of 3)]
[frame 1/38]
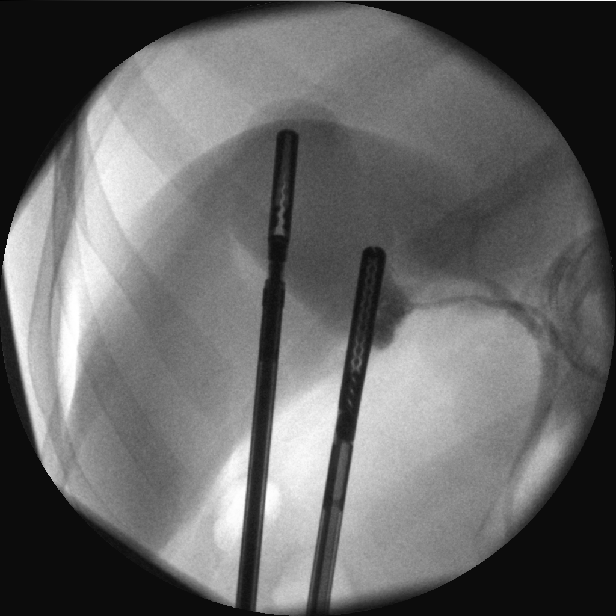
[frame 9/38]
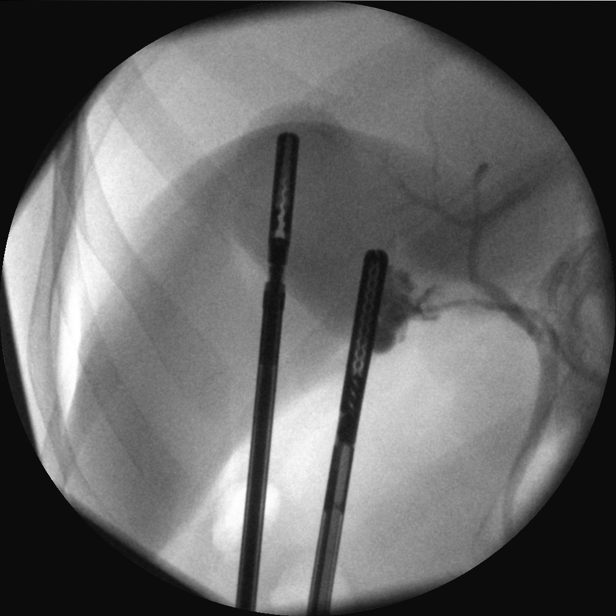
[frame 13/38]
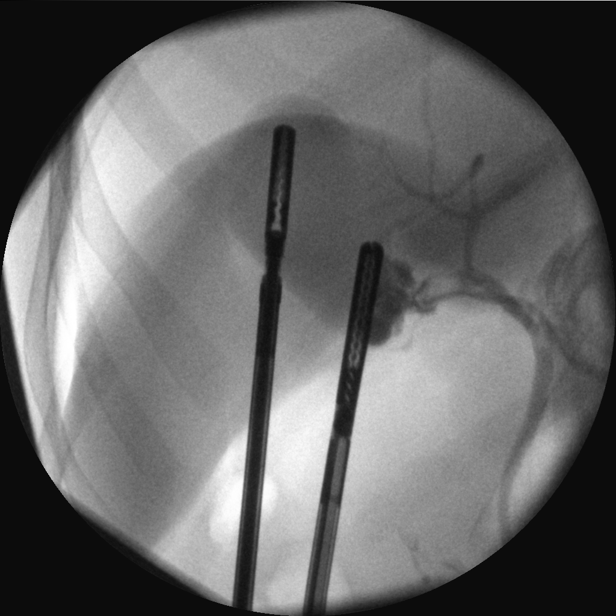
[frame 17/38]
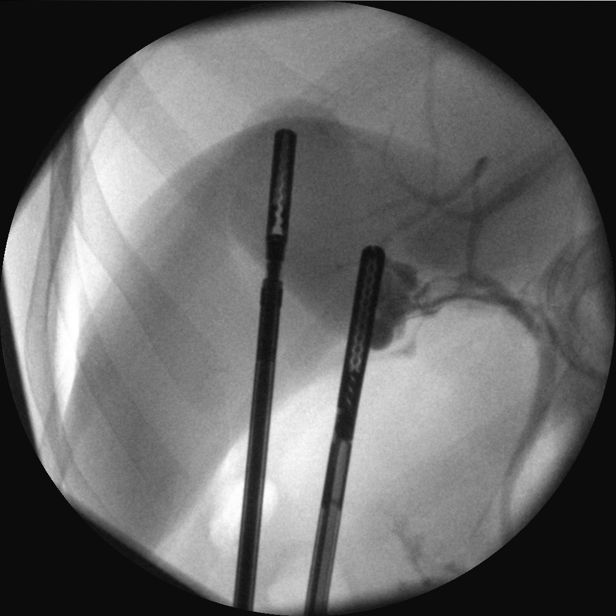
[frame 21/38]
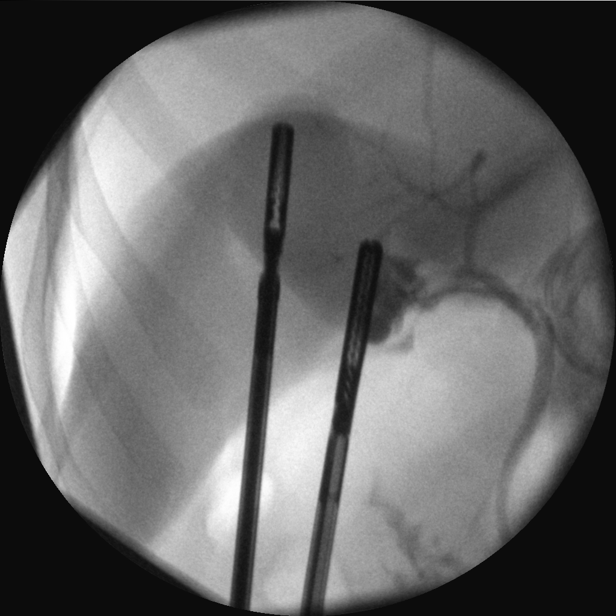
[frame 25/38]
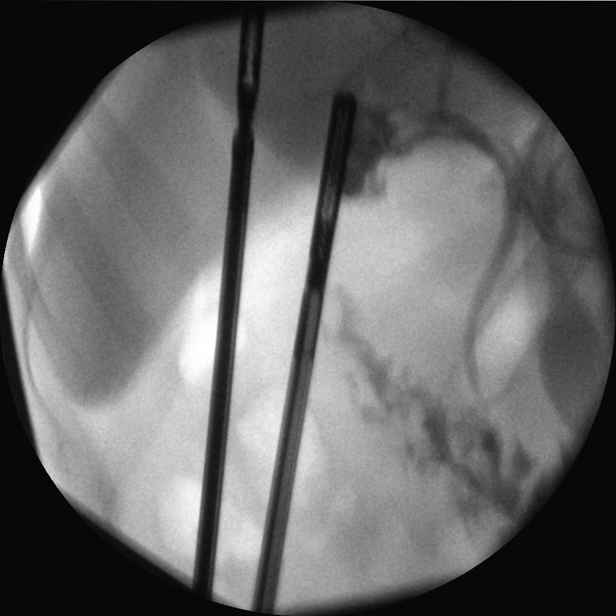
[frame 33/38]
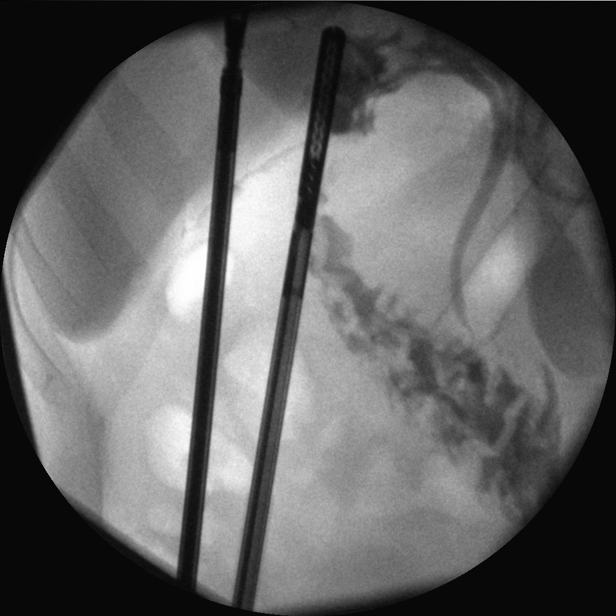
[frame 38/38]
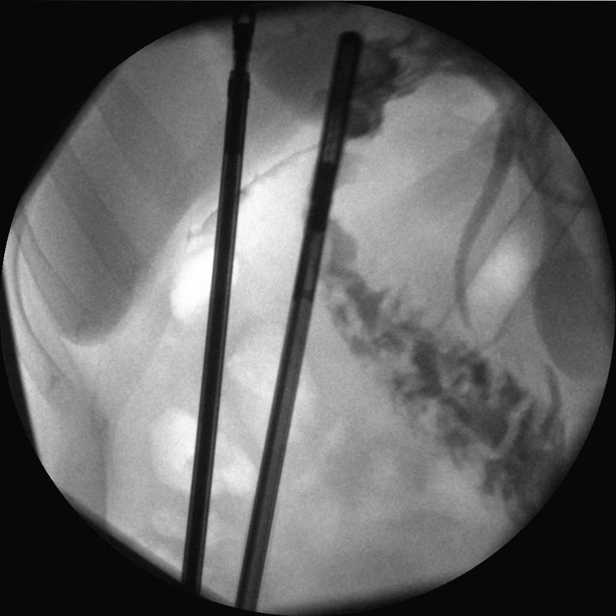

[14 of 17 positions shown; findings below may reference images not displayed]

FINDINGS: Intraoperative cholangiographic images of the right upper abdominal
quadrant during laparoscopic cholecystectomy are provided for
review.

Surgical clips overlie the expected location of the gallbladder
fossa.

Contrast injection demonstrates selective cannulation of the central
aspect of the cystic duct.

There is passage of contrast through the central aspect of the
cystic duct with filling of a non dilated common bile duct. There is
passage of contrast though the CBD and into the descending portion
of the duodenum.

There is minimal reflux of injected contrast into the common hepatic
duct and central aspect of the non dilated intrahepatic biliary
system.

There are no discrete filling defects within the opacified portions
of the biliary system to suggest the presence of
choledocholithiasis.
IMPRESSION: No evidence of choledocholithiasis.
